# Patient Record
Sex: Male | Born: 1960 | Race: Black or African American | Hispanic: No | Marital: Married | Smoking: Current every day smoker
Health system: Southern US, Community
[De-identification: ages and names within clinical notes are randomized; demographics above are authoritative.]

## PROBLEM LIST (undated history)

## (undated) DIAGNOSIS — R351 Nocturia: Secondary | ICD-10-CM

## (undated) HISTORY — PX: ROTATOR CUFF REPAIR: SHX139

---

## 2014-05-25 ENCOUNTER — Emergency Department (HOSPITAL_BASED_OUTPATIENT_CLINIC_OR_DEPARTMENT_OTHER)
Admission: EM | Admit: 2014-05-25 | Discharge: 2014-05-25 | Disposition: A | Discharge status: Home / Self Care | Payer: Self-pay | Attending: Emergency Medicine | Admitting: Emergency Medicine

## 2014-05-25 ENCOUNTER — Encounter (HOSPITAL_BASED_OUTPATIENT_CLINIC_OR_DEPARTMENT_OTHER): Payer: Self-pay | Admitting: *Deleted

## 2014-05-25 DIAGNOSIS — Z720 Tobacco use: Secondary | ICD-10-CM | POA: Insufficient documentation

## 2014-05-25 DIAGNOSIS — Z880 Allergy status to penicillin: Secondary | ICD-10-CM | POA: Insufficient documentation

## 2014-05-25 DIAGNOSIS — N410 Acute prostatitis: Secondary | ICD-10-CM | POA: Insufficient documentation

## 2014-05-25 HISTORY — DX: Nocturia: R35.1

## 2014-05-25 LAB — URINALYSIS, ROUTINE W REFLEX MICROSCOPIC
Bilirubin Urine: NEGATIVE
GLUCOSE, UA: NEGATIVE mg/dL
Hgb urine dipstick: NEGATIVE
Ketones, ur: NEGATIVE mg/dL
Nitrite: POSITIVE — AB
PROTEIN: NEGATIVE mg/dL
Specific Gravity, Urine: 1.023 (ref 1.005–1.030)
UROBILINOGEN UA: 1 mg/dL (ref 0.0–1.0)
pH: 6 (ref 5.0–8.0)

## 2014-05-25 LAB — URINE MICROSCOPIC-ADD ON

## 2014-05-25 MED ORDER — TAMSULOSIN HCL 0.4 MG PO CAPS: 0.4000 mg | ORAL_CAPSULE | Freq: Every day | ORAL | Status: DC

## 2014-05-25 MED ORDER — CIPROFLOXACIN HCL 500 MG PO TABS: 500.0000 mg | ORAL_TABLET | Freq: Two times a day (BID) | ORAL | Status: DC

## 2014-05-25 NOTE — Discharge Instructions (Signed)

## 2014-05-25 NOTE — ED Notes (Signed)
Patient states he has been on Flomax for the last three months for nocturia.  States he ran out of his prescription last week and is beginning to have frequent urination at night with a poor stream.

## 2014-05-25 NOTE — ED Provider Notes (Signed)
CSN: [id number]     Arrival date & time 05/25/14  1248 History   First MD Initiated Contact with Patient 05/25/14 1406     Chief Complaint  Patient presents with  . Medication Refill     (Consider location/radiation/quality/duration/timing/severity/associated sxs/prior Treatment) HPI Comments: Pt comes in with c/o urinary frequency and dribbling. Pt states that he was prescribed flomax by urology and he is out of his medications and would like a refill. Denies fever or abdominal pain. Denies discharge  The history is provided by the patient. No language interpreter was used.    Past Medical History  Diagnosis Date  . Nocturia    Past Surgical History  Procedure Laterality Date  . Rotator cuff repair     No family history on file. History  Substance Use Topics  . Smoking status: Current Every Day Smoker    Types: Cigarettes  . Smokeless tobacco: Never Used  . Alcohol Use: No    Review of Systems  All other systems reviewed and are negative.     Allergies  Penicillins  Home Medications   Prior to Admission medications   Medication Sig Start Date End Date Taking? Authorizing Provider  tamsulosin (FLOMAX) 0.4 MG CAPS capsule Take 0.4 mg by mouth.   Yes Historical Provider, MD   BP 136/95 mmHg  Pulse 78  Temp(Src) 98.3 F (36.8 C) (Oral)  Resp 18  Ht 6\' 2"  (1.88 m)  Wt 180 lb (81.647 kg)  BMI 23.10 kg/m2  SpO2 100% Physical Exam  Constitutional: He is oriented to person, place, and time. He appears well-developed and well-nourished.  Cardiovascular: Normal rate and regular rhythm.   Pulmonary/Chest: Effort normal and breath sounds normal.  Abdominal: Soft. Bowel sounds are normal. There is no tenderness.  Musculoskeletal: Normal range of motion.  Neurological: He is alert and oriented to person, place, and time.  Skin: Skin is warm and dry.  Nursing note and vitals reviewed.   ED Course  Procedures (including critical care time) Labs Review Labs  Reviewed  URINALYSIS, ROUTINE W REFLEX MICROSCOPIC - Abnormal; Notable for the following:    APPearance CLOUDY (*)    Nitrite POSITIVE (*)    Leukocytes, UA MODERATE (*)    All other components within normal limits  URINE MICROSCOPIC-ADD ON - Abnormal; Notable for the following:    Bacteria, UA MANY (*)    All other components within normal limits    Imaging Review No results found.   EKG Interpretation None      MDM   Final diagnoses:  Acute prostatitis    Pt treated for prostatitis and will fill flomax. Urine treated for culture    Teressa LowerVrinda Azharia Surratt, NP 05/25/14 1448  Vanetta MuldersScott Zackowski, MD 05/28/14 [id number]

## 2016-12-07 ENCOUNTER — Other Ambulatory Visit: Payer: Self-pay

## 2016-12-07 ENCOUNTER — Encounter (HOSPITAL_BASED_OUTPATIENT_CLINIC_OR_DEPARTMENT_OTHER): Payer: Self-pay | Admitting: *Deleted

## 2016-12-07 ENCOUNTER — Emergency Department (HOSPITAL_BASED_OUTPATIENT_CLINIC_OR_DEPARTMENT_OTHER)
Admission: EM | Admit: 2016-12-07 | Discharge: 2016-12-07 | Disposition: A | Discharge status: Home / Self Care | Payer: Self-pay | Attending: Emergency Medicine | Admitting: Emergency Medicine

## 2016-12-07 DIAGNOSIS — Z79899 Other long term (current) drug therapy: Secondary | ICD-10-CM | POA: Insufficient documentation

## 2016-12-07 DIAGNOSIS — R338 Other retention of urine: Secondary | ICD-10-CM

## 2016-12-07 DIAGNOSIS — F17210 Nicotine dependence, cigarettes, uncomplicated: Secondary | ICD-10-CM | POA: Insufficient documentation

## 2016-12-07 DIAGNOSIS — R339 Retention of urine, unspecified: Secondary | ICD-10-CM | POA: Insufficient documentation

## 2016-12-07 LAB — CBC WITH DIFFERENTIAL/PLATELET
Basophils Absolute: 0 10*3/uL (ref 0.0–0.1)
Basophils Relative: 0 %
EOS ABS: 0 10*3/uL (ref 0.0–0.7)
Eosinophils Relative: 0 %
HCT: 40.1 % (ref 39.0–52.0)
Hemoglobin: 13.8 g/dL (ref 13.0–17.0)
LYMPHS ABS: 1.2 10*3/uL (ref 0.7–4.0)
LYMPHS PCT: 17 %
MCH: 30.4 pg (ref 26.0–34.0)
MCHC: 34.4 g/dL (ref 30.0–36.0)
MCV: 88.3 fL (ref 78.0–100.0)
Monocytes Absolute: 0.5 10*3/uL (ref 0.1–1.0)
Monocytes Relative: 7 %
Neutro Abs: 5.4 10*3/uL (ref 1.7–7.7)
Neutrophils Relative %: 77 %
PLATELETS: 227 10*3/uL (ref 150–400)
RBC: 4.54 MIL/uL (ref 4.22–5.81)
RDW: 15.3 % (ref 11.5–15.5)
WBC: 7 10*3/uL (ref 4.0–10.5)

## 2016-12-07 LAB — URINALYSIS, ROUTINE W REFLEX MICROSCOPIC
Bilirubin Urine: NEGATIVE
Glucose, UA: NEGATIVE mg/dL
Ketones, ur: NEGATIVE mg/dL
NITRITE: NEGATIVE
PROTEIN: 100 mg/dL — AB
Specific Gravity, Urine: >1.03 — ABNORMAL HIGH (ref 1.005–1.030)
pH: 6 (ref 5.0–8.0)

## 2016-12-07 LAB — URINALYSIS, MICROSCOPIC (REFLEX)

## 2016-12-07 LAB — BASIC METABOLIC PANEL
Anion gap: 5 (ref 5–15)
BUN: 22 mg/dL — AB (ref 6–20)
CHLORIDE: 108 mmol/L (ref 101–111)
CO2: 24 mmol/L (ref 22–32)
Calcium: 9.2 mg/dL (ref 8.9–10.3)
Creatinine, Ser: 0.96 mg/dL (ref 0.61–1.24)
GFR calc Af Amer: >60 mL/min (ref >60)
GFR calc non Af Amer: >60 mL/min (ref >60)
Glucose, Bld: 114 mg/dL — ABNORMAL HIGH (ref 65–99)
Potassium: 3.7 mmol/L (ref 3.5–5.1)
Sodium: 137 mmol/L (ref 135–145)

## 2016-12-07 NOTE — ED Provider Notes (Signed)
[facility] EMERGENCY DEPARTMENT Provider Note   CSN: [id number] Arrival date & time: 12/07/16  4098     History   Chief Complaint Chief Complaint  Patient presents with  . Urinary Frequency    HPI TYNER CODNER is a 56 y.o. male.  The history is provided by the patient. No language interpreter was used.  Urinary Frequency     DASCHEL ROUGHTON is a 56 y.o. male who presents to the Emergency Department complaining of urinary frequency.  Reports that one week of urinary frequency and inability to urinate since last night with only dribbling when attempting to urinate.  He denies any abdominal pain, nausea, vomiting, penile discharge.  No new sexual partners.  He had similar symptoms several years ago and was treated with Flomax.  He does not have a family doctor and has not had any routine medical evaluations.  Past Medical History:  Diagnosis Date  . Nocturia     There are no active problems to display for this patient.   Past Surgical History:  Procedure Laterality Date  . ROTATOR CUFF REPAIR         Home Medications    Prior to Admission medications   Medication Sig Start Date End Date Taking? Authorizing Provider  ciprofloxacin (CIPRO) 500 MG tablet Take 1 tablet (500 mg total) by mouth 2 (two) times daily. 05/25/14   Teressa Lower, NP  tamsulosin (FLOMAX) 0.4 MG CAPS capsule Take 1 capsule (0.4 mg total) by mouth daily. 05/25/14   Teressa Lower, NP    Family History No family history on file.  Social History Social History   Tobacco Use  . Smoking status: Current Every Day Smoker    Types: Cigarettes  . Smokeless tobacco: Never Used  Substance Use Topics  . Alcohol use: No  . Drug use: No     Allergies   Penicillins   Review of Systems Review of Systems  Genitourinary: Positive for frequency.  All other systems reviewed and are negative.    Physical Exam Updated Vital Signs BP 134/85 (BP Location: Right Arm)   Pulse  68   Temp 98.2 F (36.8 C) (Oral)   Resp 20   Ht 6\' 2"  (1.88 m)   Wt 78.9 kg (174 lb)   SpO2 100%   BMI 22.34 kg/m   Physical Exam  Constitutional: He is oriented to person, place, and time. He appears well-developed and well-nourished.  HENT:  Head: Normocephalic and atraumatic.  Cardiovascular: Normal rate and regular rhythm.  No murmur heard. Pulmonary/Chest: Effort normal and breath sounds normal. No respiratory distress.  Abdominal:  Distended abdomen with lower abdominal fullness without any tenderness.  Musculoskeletal: He exhibits no edema or tenderness.  Neurological: He is alert and oriented to person, place, and time.  Skin: Skin is warm and dry.  Psychiatric: He has a normal mood and affect. His behavior is normal.  Nursing note and vitals reviewed.    ED Treatments / Results  Labs (all labs ordered are listed, but only abnormal results are displayed) Labs Reviewed  URINALYSIS, ROUTINE W REFLEX MICROSCOPIC - Abnormal; Notable for the following components:      Result Value   Specific Gravity, Urine >1.030 (*)    Hgb urine dipstick LARGE (*)    Protein, ur 100 (*)    Leukocytes, UA SMALL (*)    All other components within normal limits  URINALYSIS, MICROSCOPIC (REFLEX) - Abnormal; Notable for the following components:   Bacteria, UA  FEW (*)    Squamous Epithelial / LPF 0-5 (*)    All other components within normal limits  BASIC METABOLIC PANEL - Abnormal; Notable for the following components:   Glucose, Bld 114 (*)    BUN 22 (*)    All other components within normal limits  URINE CULTURE  CBC WITH DIFFERENTIAL/PLATELET    EKG  EKG Interpretation None       Radiology No results found.  Procedures Procedures (including critical care time)  Medications Ordered in ED Medications - No data to display   Initial Impression / Assessment and Plan / ED Course  I have reviewed the triage vital signs and the nursing notes.  Pertinent labs & imaging  results that were available during my care of the patient were reviewed by me and considered in my medical decision making (see chart for details).     Pt here for dribbling urination and difficulty urinating for the last week.  Foley catheter placed with 650 mL's returned.  Patient feeling significantly improved after Foley catheter placement.  UA is not consistent with UTI.  Labs demonstrate normal renal function.  Discussed with patient home care for urinary retention, Foley catheter care.  Discussed importance of urology follow-up for further evaluation and Foley catheter removal. Final Clinical Impressions(s) / ED Diagnoses   Final diagnoses:  Acute urinary retention    ED Discharge Orders    None       Tilden Fossaees, Janika Jedlicka, MD 12/07/16 1122

## 2016-12-07 NOTE — ED Triage Notes (Signed)
Pt reports reports urinary frequency x 1 week and bladder pain. States he is dribbling urine

## 2016-12-08 LAB — URINE CULTURE: Culture: NO GROWTH

## 2016-12-10 ENCOUNTER — Emergency Department (HOSPITAL_BASED_OUTPATIENT_CLINIC_OR_DEPARTMENT_OTHER)
Admission: EM | Admit: 2016-12-10 | Discharge: 2016-12-10 | Disposition: A | Discharge status: Home / Self Care | Payer: Self-pay | Attending: Emergency Medicine | Admitting: Emergency Medicine

## 2016-12-10 ENCOUNTER — Encounter (HOSPITAL_BASED_OUTPATIENT_CLINIC_OR_DEPARTMENT_OTHER): Payer: Self-pay | Admitting: *Deleted

## 2016-12-10 ENCOUNTER — Other Ambulatory Visit: Payer: Self-pay

## 2016-12-10 DIAGNOSIS — F17210 Nicotine dependence, cigarettes, uncomplicated: Secondary | ICD-10-CM | POA: Insufficient documentation

## 2016-12-10 DIAGNOSIS — N390 Urinary tract infection, site not specified: Secondary | ICD-10-CM | POA: Insufficient documentation

## 2016-12-10 DIAGNOSIS — Z466 Encounter for fitting and adjustment of urinary device: Secondary | ICD-10-CM | POA: Insufficient documentation

## 2016-12-10 DIAGNOSIS — R319 Hematuria, unspecified: Secondary | ICD-10-CM | POA: Insufficient documentation

## 2016-12-10 LAB — URINALYSIS, ROUTINE W REFLEX MICROSCOPIC

## 2016-12-10 LAB — URINALYSIS, MICROSCOPIC (REFLEX)

## 2016-12-10 MED ORDER — CIPROFLOXACIN HCL 500 MG PO TABS: 500.0000 mg | ORAL_TABLET | Freq: Two times a day (BID) | ORAL | 0 refills | Status: AC

## 2016-12-10 MED ORDER — TAMSULOSIN HCL 0.4 MG PO CAPS
0.4000 mg | ORAL_CAPSULE | Freq: Once | ORAL | Status: AC
  Administered 2016-12-10: 0.4 mg via ORAL
  Filled 2016-12-10: qty 1

## 2016-12-10 MED ORDER — TAMSULOSIN HCL 0.4 MG PO CAPS: 0.4000 mg | ORAL_CAPSULE | Freq: Every day | ORAL | 1 refills | Status: DC

## 2016-12-10 MED FILL — TAMSULOSIN HCL 0.4 MG CAP: 0.4 | 30 days supply | Qty: 30 | Fill #0

## 2016-12-10 MED FILL — CIPROFLOXACIN HCL 500 MG TA: 500 | 10 days supply | Qty: 20 | Fill #0

## 2016-12-10 NOTE — ED Triage Notes (Signed)
Pt states he was seen here 3 days ago and had foley placed for retention and told to return today for removal. Pt was unable to make his follow up apt with urology due to not having $250 to pay up front.

## 2016-12-10 NOTE — ED Notes (Addendum)
Urinal provided to pt, made aware of need for UA.

## 2016-12-10 NOTE — ED Provider Notes (Signed)
[facility] EMERGENCY DEPARTMENT Provider Note   CSN: [id number] Arrival date & time: 12/10/16  0830     History   Chief Complaint Chief Complaint  Patient presents with  . Follow-up    HPI Tommy West is a 56 y.o. male.  HPI   Presents with desire for urinary cathter removal. Reports history of prostate issues with urinary difficulties who presented 2 days ago with concern for urinary frequency, hesitancy, inability to urinate and had foley cathter placed. Reports that when he takes flomax he is able to avoid obstruction symptoms, however he ran out of his flomax resulting in the obstruction symptoms he had a few days ago.  He noted some pink colored hematuria in his cathter today. It is flowing normally.  He attempted to see Urology but they required 250 dollar copay and he was unable to go to appointment. Reports he is scheduled Dec 13th to see resident in urology clinic.  Denies fevers, flank pain, vomiting.    Past Medical History:  Diagnosis Date  . Nocturia     There are no active problems to display for this patient.   Past Surgical History:  Procedure Laterality Date  . ROTATOR CUFF REPAIR         Home Medications    Prior to Admission medications   Medication Sig Start Date End Date Taking? Authorizing Provider  ciprofloxacin (CIPRO) 500 MG tablet Take 1 tablet (500 mg total) 2 (two) times daily for 10 days by mouth. 12/10/16 12/20/16  Alvira Monday, MD  tamsulosin (FLOMAX) 0.4 MG CAPS capsule Take 1 capsule (0.4 mg total) daily by mouth. 12/10/16   Alvira Monday, MD    Family History History reviewed. No pertinent family history.  Social History Social History   Tobacco Use  . Smoking status: Current Every Day Smoker    Types: Cigarettes  . Smokeless tobacco: Never Used  Substance Use Topics  . Alcohol use: No  . Drug use: No     Allergies   Penicillins   Review of Systems Review of Systems  Constitutional: Negative  for fever.  HENT: Negative for sore throat.   Eyes: Negative for visual disturbance.  Respiratory: Negative for shortness of breath.   Cardiovascular: Negative for chest pain.  Gastrointestinal: Negative for abdominal pain, diarrhea and vomiting.  Genitourinary: Positive for difficulty urinating and hematuria. Negative for flank pain.  Musculoskeletal: Negative for back pain and neck stiffness.  Skin: Negative for rash.  Neurological: Negative for syncope and headaches.     Physical Exam Updated Vital Signs BP (!) 154/102 (BP Location: Right Arm)   Pulse 72   Temp 98.2 F (36.8 C) (Oral)   Resp 16   Ht 6\' 2"  (1.88 m)   Wt 78.5 kg (173 lb)   SpO2 98%   BMI 22.21 kg/m   Physical Exam  Constitutional: He is oriented to person, place, and time. He appears well-developed and well-nourished. No distress.  HENT:  Head: Normocephalic and atraumatic.  Eyes: Conjunctivae and EOM are normal.  Neck: Normal range of motion.  Cardiovascular: Normal rate, regular rhythm and intact distal pulses.  Pulmonary/Chest: Effort normal and breath sounds normal. No respiratory distress. He has no wheezes. He has no rales.  Abdominal: Soft. He exhibits no distension. There is no tenderness. There is no guarding.  Musculoskeletal: He exhibits no edema.  Neurological: He is alert and oriented to person, place, and time.  Skin: Skin is warm and dry. He is not diaphoretic.  Nursing note and vitals reviewed.    ED Treatments / Results  Labs (all labs ordered are listed, but only abnormal results are displayed) Labs Reviewed  URINALYSIS, ROUTINE W REFLEX MICROSCOPIC - Abnormal; Notable for the following components:      Result Value   Color, Urine RED (*)    APPearance TURBID (*)    Glucose, UA   (*)    Value: TEST NOT REPORTED DUE TO COLOR INTERFERENCE OF URINE PIGMENT   Hgb urine dipstick   (*)    Value: TEST NOT REPORTED DUE TO COLOR INTERFERENCE OF URINE PIGMENT   Bilirubin Urine   (*)     Value: TEST NOT REPORTED DUE TO COLOR INTERFERENCE OF URINE PIGMENT   Ketones, ur   (*)    Value: TEST NOT REPORTED DUE TO COLOR INTERFERENCE OF URINE PIGMENT   Protein, ur   (*)    Value: TEST NOT REPORTED DUE TO COLOR INTERFERENCE OF URINE PIGMENT   Nitrite   (*)    Value: TEST NOT REPORTED DUE TO COLOR INTERFERENCE OF URINE PIGMENT   Leukocytes, UA   (*)    Value: TEST NOT REPORTED DUE TO COLOR INTERFERENCE OF URINE PIGMENT   All other components within normal limits  URINALYSIS, MICROSCOPIC (REFLEX) - Abnormal; Notable for the following components:   Bacteria, UA FEW (*)    Squamous Epithelial / LPF 0-5 (*)    All other components within normal limits  URINE CULTURE    EKG  EKG Interpretation None       Radiology No results found.  Procedures Procedures (including critical care time)  Medications Ordered in ED Medications  tamsulosin (FLOMAX) capsule 0.4 mg (0.4 mg Oral Given 12/10/16 0932)     Initial Impression / Assessment and Plan / ED Course  I have reviewed the triage vital signs and the nursing notes.  Pertinent labs & imaging results that were available during my care of the patient were reviewed by me and considered in my medical decision making (see chart for details).     56yo male with history of urinary retention with foley catheter placement 2 days ago presents with desire for catheter removal. Discussed options including patient keeping indwelling foley until follow up visit scheduled in December, versus voiding trial today.  He reports that when he is on flomax he does not have these issues, however he ran out of flomax resulting in retention 2 days ago.  Given this history, discussed it is not unreasonable to trial removing foley and initiating flomax.  Removed foley and patient able to urinate.  Minimal urine on post void residual.  Urinalysis shows hematuria, difficult to interpret, however will treat for possible UTI with cipro.  Renal function normal  days ago after he present with obstruction symptoms. Discussed symptoms of obstruction in detail with patient and encouraged return if he again is unable to fully empty bladder, risks of renal injury if he has persistent obstruction, and importance of outpatient follow up.  Pt states understanding. Patient discharged in stable condition with understanding of reasons to return.   Final Clinical Impressions(s) / ED Diagnoses   Final diagnoses:  Encounter for Foley catheter removal  Hematuria, unspecified type  Urinary tract infection with hematuria, site unspecified    ED Discharge Orders        Ordered    ciprofloxacin (CIPRO) 500 MG tablet  2 times daily     12/10/16 1256    tamsulosin (FLOMAX) 0.4 MG CAPS  capsule  Daily     12/10/16 1256       Alvira MondaySchlossman, Laurieanne Galloway, MD 12/11/16 [phone]

## 2016-12-11 LAB — URINE CULTURE

## 2017-01-08 MED FILL — TAMSULOSIN HCL 0.4 MG CAP: 0.4 | 30 days supply | Qty: 30 | Fill #1

## 2017-12-03 ENCOUNTER — Encounter (HOSPITAL_BASED_OUTPATIENT_CLINIC_OR_DEPARTMENT_OTHER): Payer: Self-pay | Admitting: Emergency Medicine

## 2017-12-03 ENCOUNTER — Emergency Department (HOSPITAL_BASED_OUTPATIENT_CLINIC_OR_DEPARTMENT_OTHER)
Admission: EM | Admit: 2017-12-03 | Discharge: 2017-12-03 | Disposition: A | Discharge status: Home / Self Care | Payer: BLUE CROSS/BLUE SHIELD | Attending: Emergency Medicine | Admitting: Emergency Medicine

## 2017-12-03 ENCOUNTER — Emergency Department (HOSPITAL_BASED_OUTPATIENT_CLINIC_OR_DEPARTMENT_OTHER): Payer: BLUE CROSS/BLUE SHIELD

## 2017-12-03 ENCOUNTER — Other Ambulatory Visit: Payer: Self-pay

## 2017-12-03 DIAGNOSIS — N210 Calculus in bladder: Secondary | ICD-10-CM | POA: Diagnosis not present

## 2017-12-03 DIAGNOSIS — N401 Enlarged prostate with lower urinary tract symptoms: Secondary | ICD-10-CM | POA: Diagnosis not present

## 2017-12-03 DIAGNOSIS — R300 Dysuria: Secondary | ICD-10-CM | POA: Diagnosis present

## 2017-12-03 DIAGNOSIS — F17210 Nicotine dependence, cigarettes, uncomplicated: Secondary | ICD-10-CM | POA: Insufficient documentation

## 2017-12-03 DIAGNOSIS — Z79899 Other long term (current) drug therapy: Secondary | ICD-10-CM | POA: Insufficient documentation

## 2017-12-03 LAB — BASIC METABOLIC PANEL
Anion gap: 9 (ref 5–15)
BUN: 18 mg/dL (ref 6–20)
CO2: 26 mmol/L (ref 22–32)
Calcium: 9 mg/dL (ref 8.9–10.3)
Chloride: 101 mmol/L (ref 98–111)
Creatinine, Ser: 1.01 mg/dL (ref 0.61–1.24)
GFR calc Af Amer: >60 mL/min (ref >60)
GFR calc non Af Amer: >60 mL/min (ref >60)
Glucose, Bld: 91 mg/dL (ref 70–99)
Potassium: 3.4 mmol/L — ABNORMAL LOW (ref 3.5–5.1)
Sodium: 136 mmol/L (ref 135–145)

## 2017-12-03 LAB — URINALYSIS, ROUTINE W REFLEX MICROSCOPIC
BILIRUBIN URINE: NEGATIVE
Glucose, UA: NEGATIVE mg/dL
KETONES UR: NEGATIVE mg/dL
Leukocytes, UA: NEGATIVE
NITRITE: NEGATIVE
PH: 6 (ref 5.0–8.0)
Protein, ur: NEGATIVE mg/dL
Specific Gravity, Urine: 1.025 (ref 1.005–1.030)

## 2017-12-03 LAB — CBC
HEMATOCRIT: 41.6 % (ref 39.0–52.0)
Hemoglobin: 13.5 g/dL (ref 13.0–17.0)
MCH: 29.6 pg (ref 26.0–34.0)
MCHC: 32.5 g/dL (ref 30.0–36.0)
MCV: 91.2 fL (ref 80.0–100.0)
Platelets: 215 10*3/uL (ref 150–400)
RBC: 4.56 MIL/uL (ref 4.22–5.81)
RDW: 14.9 % (ref 11.5–15.5)
WBC: 8.1 10*3/uL (ref 4.0–10.5)
nRBC: 0 % (ref 0.0–0.2)

## 2017-12-03 LAB — URINALYSIS, MICROSCOPIC (REFLEX)

## 2017-12-03 MED ORDER — TAMSULOSIN HCL 0.4 MG PO CAPS
0.4000 mg | ORAL_CAPSULE | Freq: Every day | ORAL | 1 refills | Status: AC
Start: 2017-12-03 — End: ?

## 2017-12-03 MED FILL — TAMSULOSIN HCL 0.4 MG CAP: 0.4 | 30 days supply | Qty: 30 | Fill #0

## 2017-12-03 NOTE — ED Notes (Signed)
ED Provider at bedside. 

## 2017-12-03 NOTE — Discharge Instructions (Signed)
Your labs overall look good and your urine does not show any signs of infection but your CT scan showed several bladder stones, the largest of which was 14 mm, you will need to follow-up with urology regarding these as they will likely have to do lithotripsy to help make them smaller so that they can be passed.  These call today to schedule a follow-up appointment with urology, continue taking tamsulosin as prescribed.  Tylenol as needed for pain.  Return to the emergency department if you are unable to pass urine, develop fevers, worsening urinary symptoms, persistent vomiting or severe flank or abdominal pain or any other new or concerning symptoms.

## 2017-12-03 NOTE — ED Provider Notes (Signed)
[facility] EMERGENCY DEPARTMENT Provider Note   CSN: [id number] Arrival date & time: 12/03/17  1206     History   Chief Complaint Chief Complaint  Patient presents with  . Dysuria    HPI Tommy West is a 57 y.o. male.  Tommy West is a 57 y.o. Male who presents to the emergency department for evaluation of dysuria.  He reports for the past 2-3 weeks he has had intermittent discomfort with urination and suprapubic pain.  He also reports some intermittent pains in his right flank.  He reports about a month ago he passed 2 small stones which he has brought with him in a small envelope, reports since then he is intermittently had some blood in his urine he denies any burning with urination, or urinary frequency.  But reports every time he urinates now he has pain suprapubically and his penis.  He denies any pain or swelling in the testicles or scrotum.  He has not had any fevers, denies any nausea or vomiting.  He reports prior to passing those 2 stones a month ago he has no history of kidney stones.  He has not followed up with his primary care doctor or seen urology regarding this.  He does have a history of BPH and takes Flomax regularly. No penile discharge, no new sexual partners.     Past Medical History:  Diagnosis Date  . Nocturia     There are no active problems to display for this patient.   Past Surgical History:  Procedure Laterality Date  . ROTATOR CUFF REPAIR          Home Medications    Prior to Admission medications   Medication Sig Start Date End Date Taking? Authorizing Provider  tamsulosin (FLOMAX) 0.4 MG CAPS capsule Take 1 capsule (0.4 mg total) by mouth daily. 12/03/17   Dartha Lodge, PA-C    Family History History reviewed. No pertinent family history.  Social History Social History   Tobacco Use  . Smoking status: Current Every Day Smoker    Types: Cigarettes  . Smokeless tobacco: Never Used  Substance Use Topics  .  Alcohol use: No  . Drug use: No     Allergies   Penicillins   Review of Systems Review of Systems  Constitutional: Negative for chills and fever.  Respiratory: Negative for cough and shortness of breath.   Cardiovascular: Negative for chest pain.  Gastrointestinal: Positive for abdominal pain (suprapubic). Negative for blood in stool, constipation, diarrhea, nausea and vomiting.  Genitourinary: Positive for dysuria, flank pain, hematuria and penile pain. Negative for frequency, scrotal swelling and testicular pain.  Musculoskeletal: Negative for back pain.  Skin: Negative for color change and rash.  All other systems reviewed and are negative.    Physical Exam Updated Vital Signs BP (!) 154/81 (BP Location: Left Arm)   Pulse 66   Temp 98.2 F (36.8 C) (Oral)   Resp 20   Ht 6' 2.5" (1.892 m)   Wt 77.1 kg   SpO2 100%   BMI 21.53 kg/m   Physical Exam  Constitutional: He appears well-developed and well-nourished. No distress.  HENT:  Head: Normocephalic and atraumatic.  Mouth/Throat: Oropharynx is clear and moist.  Eyes: Right eye exhibits no discharge. Left eye exhibits no discharge.  Neck: Neck supple.  Cardiovascular: Normal rate, regular rhythm, normal heart sounds and intact distal pulses.  Pulmonary/Chest: Effort normal and breath sounds normal. No respiratory distress.  Respirations equal and unlabored,  patient able to speak in full sentences, lungs clear to auscultation bilaterally  Abdominal: Soft. Bowel sounds are normal. He exhibits no distension and no mass. There is no tenderness. There is no guarding.  Abdomen soft, nondistended, nontender to palpation in all quadrants without guarding or peritoneal signs, no CVA tenderness bilaterally  Genitourinary:  Genitourinary Comments: Chaperone present Uncircumcised penis, no discharge noted at urinary meatus, no penile tenderness, no pain or swelling over the testicles or scrotum  Neurological: He is alert.  Coordination normal.  Skin: Skin is warm and dry. Capillary refill takes less than 2 seconds. He is not diaphoretic.  Psychiatric: He has a normal mood and affect. His behavior is normal.  Nursing note and vitals reviewed.    ED Treatments / Results  Labs (all labs ordered are listed, but only abnormal results are displayed) Labs Reviewed  URINALYSIS, ROUTINE W REFLEX MICROSCOPIC - Abnormal; Notable for the following components:      Result Value   APPearance CLOUDY (*)    Hgb urine dipstick LARGE (*)    All other components within normal limits  URINALYSIS, MICROSCOPIC (REFLEX) - Abnormal; Notable for the following components:   Bacteria, UA FEW (*)    All other components within normal limits  BASIC METABOLIC PANEL - Abnormal; Notable for the following components:   Potassium 3.4 (*)    All other components within normal limits  CBC    EKG None  Radiology Ct Renal Stone Study  Result Date: 12/03/2017 CLINICAL DATA:  Dysuria and flank pain.  History of kidney stones. EXAM: CT ABDOMEN AND PELVIS WITHOUT CONTRAST TECHNIQUE: Multidetector CT imaging of the abdomen and pelvis was performed following the standard protocol without IV contrast. COMPARISON:  None. FINDINGS: Lower chest: No acute abnormality. Hepatobiliary: No focal liver abnormality is seen. No gallstones, gallbladder wall thickening, or biliary dilatation. Pancreas: Unremarkable. No pancreatic ductal dilatation or surrounding inflammatory changes. Spleen: Normal in size without focal abnormality. Adrenals/Urinary Tract: Adrenal glands are unremarkable. Kidneys are normal, without renal calculi, focal lesion, or hydronephrosis. There is a 2 mm calculus in the bladder near the urethra origin. There are multiple, at least 6 additional large calculi layering in the posterior bladder, measuring up to 14 mm. Stomach/Bowel: Stomach is within normal limits. Appendix appears normal. No evidence of bowel wall thickening, distention,  or inflammatory changes. Vascular/Lymphatic: No significant vascular findings are present. No enlarged abdominal or pelvic lymph nodes. Reproductive: Mild prostatomegaly. Other: No abdominal wall hernia or abnormality. No abdominopelvic ascites. No pneumoperitoneum. Musculoskeletal: No acute or significant osseous findings. Prominent bilateral sacroiliac joint sclerosis with subtle erosive changes. IMPRESSION: 1. Multiple bladder calculi measuring up to 14 mm. Tiny 2 mm calculus near the urethra origin. No renal or ureteral calculi. 2. Prominent bilateral sacroiliac joint sclerosis with subtle erosive changes, consistent with sacroiliitis. Electronically Signed   By: Obie Dredge M.D.   On: 12/03/2017 13:12    Procedures Procedures (including critical care time)  Medications Ordered in ED Medications - No data to display   Initial Impression / Assessment and Plan / ED Course  I have reviewed the triage vital signs and the nursing notes.  Pertinent labs & imaging results that were available during my care of the patient were reviewed by me and considered in my medical decision making (see chart for details).  Presents to the emergency department for evaluation of 2 to 3 weeks of intermittent dysuria as well as hematuria and some suprapubic and penile discomfort.  He passed  2 small renal stones about a month ago but prior to that had no history of kidney stones, does have a history of BPH and takes Flomax.  He does not have any abdominal tenderness, no CVA tenderness he has normal vitals aside from mild hypertension and is well-appearing.  Will get urinalysis, basic labs and a CT renal stone study.  Work is overall reassuring no leukocytes ptosis, normal hemoglobin, no acute electrolyte derangements and normal renal function.  Urinalysis shows large amount of hemoglobin and red cells with no nitrites, leukocytes or white blood cells only very few bacteria present, not concerning for infection.  CT  renal stone study shows multiple bladder calculi measuring up to 14 mm with a tiny 2 mm calculus near the urethral origin no evidence of obstructing renal or ureteral calculi.  There is also signs of sacroiliitis, but patient is not having any low back discomfort.  Given reassuring lab work and no evidence of obstructing stones feel patient is stable for discharge home with continued Flomax and close follow-up with urology as given the size of some of the stones present in the bladder he will likely require lithotripsy.  I discussed this plan with patient and he expresses understanding and is in agreement, is in no pain at discharge.  Final Clinical Impressions(s) / ED Diagnoses   Final diagnoses:  Bladder stones  Dysuria    ED Discharge Orders         Ordered    tamsulosin (FLOMAX) 0.4 MG CAPS capsule  Daily     12/03/17 1504           Dartha Lodge, [place] 12/03/17 1642    Gwyneth Sprout, MD 12/04/17 2006

## 2017-12-03 NOTE — ED Triage Notes (Addendum)
Reports dysuria x 2-3 weeks.  Reports history of kidney stones.  States he passed 1 approximately 1 month ago.

## 2018-01-04 MED FILL — TAMSULOSIN HCL 0.4 MG CAP: 0.4 | 30 days supply | Qty: 30 | Fill #1

## 2018-03-19 ENCOUNTER — Emergency Department (HOSPITAL_BASED_OUTPATIENT_CLINIC_OR_DEPARTMENT_OTHER): Payer: BLUE CROSS/BLUE SHIELD

## 2018-03-19 ENCOUNTER — Emergency Department (HOSPITAL_BASED_OUTPATIENT_CLINIC_OR_DEPARTMENT_OTHER)
Admission: EM | Admit: 2018-03-19 | Discharge: 2018-03-19 | Disposition: A | Discharge status: Home / Self Care | Payer: BLUE CROSS/BLUE SHIELD | Attending: Emergency Medicine | Admitting: Emergency Medicine

## 2018-03-19 ENCOUNTER — Other Ambulatory Visit: Payer: Self-pay

## 2018-03-19 ENCOUNTER — Encounter (HOSPITAL_BASED_OUTPATIENT_CLINIC_OR_DEPARTMENT_OTHER): Payer: Self-pay | Admitting: *Deleted

## 2018-03-19 DIAGNOSIS — R499 Unspecified voice and resonance disorder: Secondary | ICD-10-CM | POA: Diagnosis not present

## 2018-03-19 DIAGNOSIS — F17210 Nicotine dependence, cigarettes, uncomplicated: Secondary | ICD-10-CM | POA: Diagnosis not present

## 2018-03-19 DIAGNOSIS — R491 Aphonia: Secondary | ICD-10-CM | POA: Diagnosis present

## 2018-03-19 MED ORDER — DEXAMETHASONE SODIUM PHOSPHATE 10 MG/ML IJ SOLN
10.0000 mg | Freq: Once | INTRAMUSCULAR | Status: AC
  Administered 2018-03-19: 10 mg via INTRAMUSCULAR
  Filled 2018-03-19: qty 1

## 2018-03-19 MED ORDER — PANTOPRAZOLE SODIUM 40 MG PO TBEC: 40.0000 mg | DELAYED_RELEASE_TABLET | Freq: Every day | ORAL | 0 refills | Status: AC

## 2018-03-19 NOTE — Discharge Instructions (Signed)
You were seen in the ED today with voice change. Call the ENT listed for an evaluation in the coming week. Return to the ED with any trouble breathing, difficulty swallowing, or other suddenly worsening symptoms.

## 2018-03-19 NOTE — ED Provider Notes (Signed)
Emergency Department Provider Note   I have reviewed the triage vital signs and the nursing notes.   HISTORY  Chief Complaint Loss of voice   HPI Tommy West is a 58 y.o. male presents to the emergency department for evaluation of change in voice.  Symptoms have been persistent over the last week.  Patient describes voice changes that are worse in the morning and by the evening they seem to have resolved.  Patient has occasional coughing but no runny nose, congestion symptoms.  No fevers or chills.  No sore throat or pain.  He does not feel short of breath.  He is not having any difficulty swallowing either solids or liquids.  He is not experiencing any chest or epigastric pain.  No prior history of similar symptoms.  The patient does smoke cigarettes.  No radiation of symptoms or other modifying factors.  Past Medical History:  Diagnosis Date  . Nocturia     There are no active problems to display for this patient.   Past Surgical History:  Procedure Laterality Date  . ROTATOR CUFF REPAIR      Allergies Penicillins  Family History  Problem Relation Age of Onset  . Cancer Sister     Social History Social History   Tobacco Use  . Smoking status: Current Every Day Smoker    Types: Cigarettes  . Smokeless tobacco: Never Used  Substance Use Topics  . Alcohol use: No  . Drug use: No    Review of Systems  Constitutional: No fever/chills Eyes: No visual changes. ENT: No sore throat. Positive voice changes.  Cardiovascular: Denies chest pain. Respiratory: Denies shortness of breath. Gastrointestinal: No abdominal pain.  No nausea, no vomiting.  No diarrhea.  No constipation. Genitourinary: Negative for dysuria. Musculoskeletal: Negative for back pain. Skin: Negative for rash. Neurological: Negative for headaches, focal weakness or numbness.  10-point ROS otherwise negative.  ____________________________________________   PHYSICAL EXAM:  VITAL  SIGNS: ED Triage Vitals  Enc Vitals Group     BP 03/19/18 0923 131/90     Pulse Rate 03/19/18 0923 76     Resp 03/19/18 0923 18     Temp 03/19/18 0923 98 F (36.7 C)     Temp Source 03/19/18 0923 Oral     SpO2 03/19/18 0923 100 %     Weight 03/19/18 0923 170 lb (77.1 kg)     Height 03/19/18 0924 6\' 2"  (1.88 m)     Pain Score 03/19/18 0924 0   Constitutional: Alert and oriented. Well appearing and in no acute distress. Eyes: Conjunctivae are normal.  Head: Atraumatic. Nose: No congestion/rhinnorhea. Mouth/Throat: Mucous membranes are moist.  Oropharynx non-erythematous. Widely patent. Symmetric palatal rise. Managing oral secretions. No PTA. Soft submandibular compartment.  Neck: No stridor.   Cardiovascular: Normal rate, regular rhythm. Good peripheral circulation. Grossly normal heart sounds.   Respiratory: Normal respiratory effort.  No retractions. Lungs CTAB. Gastrointestinal: No distention.  Musculoskeletal: No lower extremity tenderness nor edema. No gross deformities of extremities. Neurologic:  Normal speech and language. Skin:  Skin is warm, dry and intact. No rash noted.  ____________________________________________  RADIOLOGY  Dg Neck Soft Tissue  Result Date: 03/19/2018 CLINICAL DATA:  Laryngitis for the last week.  No pain or cough. EXAM: NECK SOFT TISSUES - 1+ VIEW COMPARISON:  None. FINDINGS: The prevertebral soft tissues are normal. There is no thickening of the epiglottis or evidence of foreign body. Patient is nearly edentulous. There is moderate multilevel cervical spondylosis  with disc space narrowing and osteophyte formation from C4-5 through C6-7. IMPRESSION: Unremarkable soft tissues of the neck.  Cervical spondylosis. Electronically Signed   By: Carey Bullocks M.D.   On: 03/19/2018 10:29    ____________________________________________   PROCEDURES  Procedure(s) performed:   Procedures  None   ____________________________________________   INITIAL IMPRESSION / ASSESSMENT AND PLAN / ED COURSE  Pertinent labs & imaging results that were available during my care of the patient were reviewed by me and considered in my medical decision making (see chart for details).  Patient presents to the emergency department with loss of voice.  He is not short of breath.  Swallowing normally.  Normal external and intraoral exam.  Mild cough but does not appear to be related to laryngitis.  Patient is a smoker.  He will likely require ENT evaluation which I discussed with him.  Plan to obtain plain films of the neck but low suspicion for deep space infection or FB.   Plain film of the neck reviewed with no acute findings.  Plan for Decadron here along with Protonix in case GERD symptoms might be causing his voice change.  I also advised close ENT follow-up for direct visualization of the vocal cords and larynx especially in the setting of his Arsenio Schnorr smoking history.  Patient understanding and in agreement with plan at discharge. ____________________________________________  FINAL CLINICAL IMPRESSION(S) / ED DIAGNOSES  Final diagnoses:  Change in voice     MEDICATIONS GIVEN DURING THIS VISIT:  Medications  dexamethasone (DECADRON) injection 10 mg (10 mg Intramuscular Given 03/19/18 1039)     NEW OUTPATIENT MEDICATIONS STARTED DURING THIS VISIT:  Discharge Medication List as of 03/19/2018 10:35 AM    START taking these medications   Details  pantoprazole (PROTONIX) 40 MG tablet Take 1 tablet (40 mg total) by mouth daily for 30 days., Starting Sat 03/19/2018, Until Mon 04/18/2018, Print        Note:  This document was prepared using Dragon voice recognition software and may include unintentional dictation errors.  Alona Bene, MD Emergency Medicine    Diego Delancey, Arlyss Repress, MD 03/19/18 1949

## 2018-03-19 NOTE — ED Triage Notes (Signed)
Loss of voice for a week.  Denies any coughing or fever.

## 2019-08-08 IMAGING — CT CT RENAL STONE PROTOCOL
2 of 4 series · 16 of 46 positions shown, 18 images · non-contrast
Comparison: None.

CLINICAL DATA: Dysuria and flank pain.  History of kidney stones.

EXAM:
CT ABDOMEN AND PELVIS WITHOUT CONTRAST
TECHNIQUE: Multidetector CT imaging of the abdomen and pelvis was performed
following the standard protocol without IV contrast.

[Series 2: axial st · axial · 0.74mm/px · z∈[+742,+1118]mm · 13 of 86 slices shown, 15 images]
[im 7/86  soft-tissue]
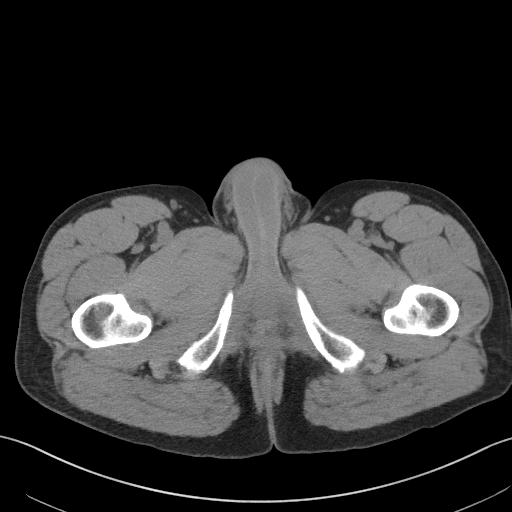
[im 7/86  bone]
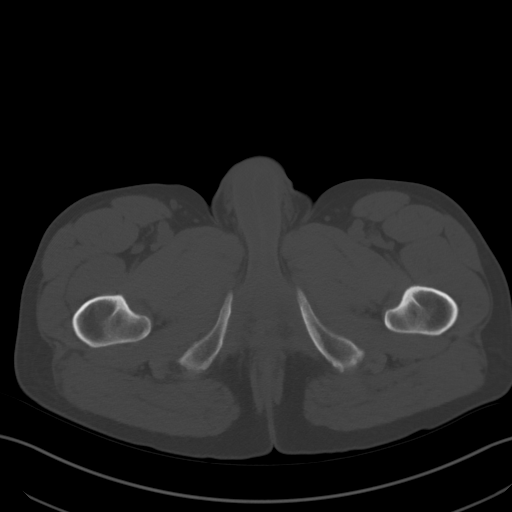
[im 13/86  soft-tissue]
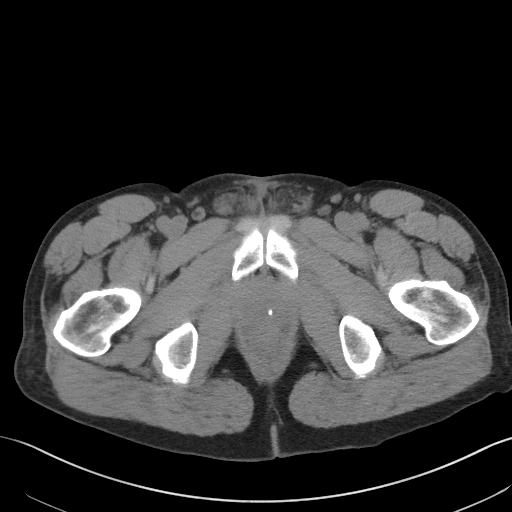
[im 19/86  soft-tissue]
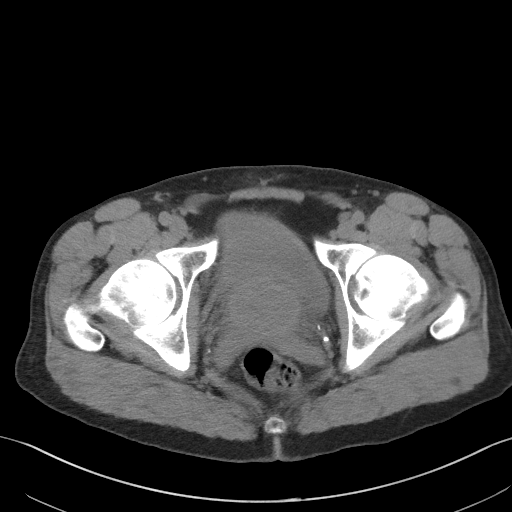
[im 26/86  soft-tissue]
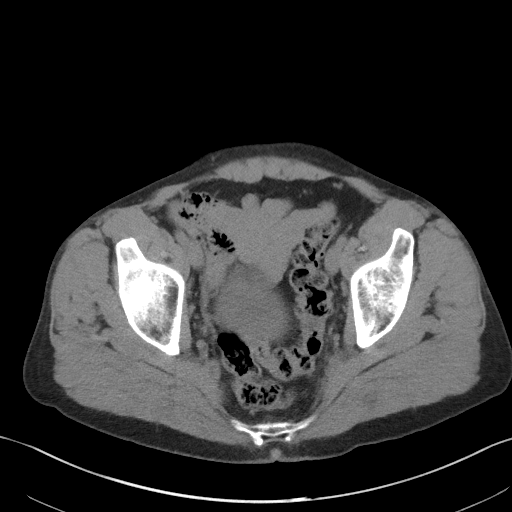
[im 32/86  soft-tissue]
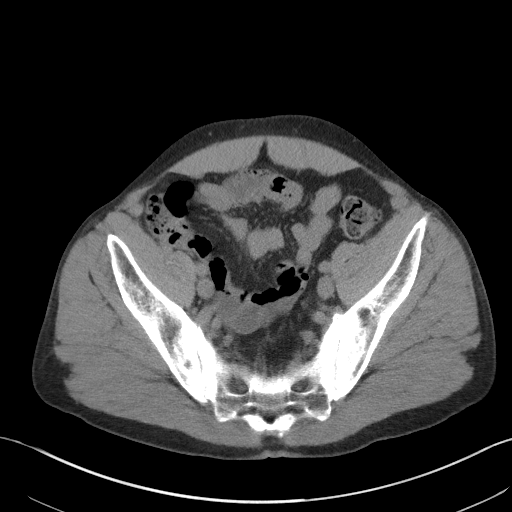
[im 38/86  soft-tissue]
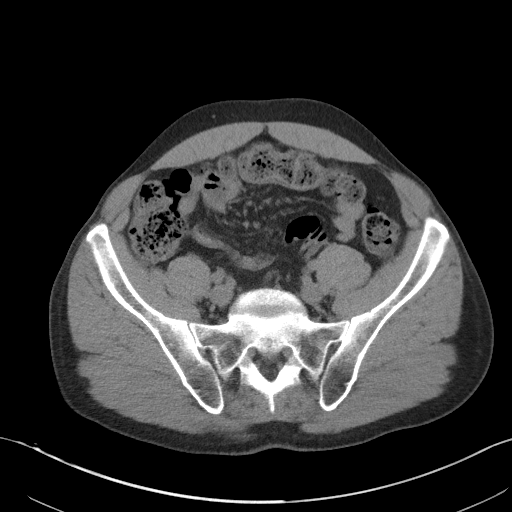
[im 45/86  soft-tissue]
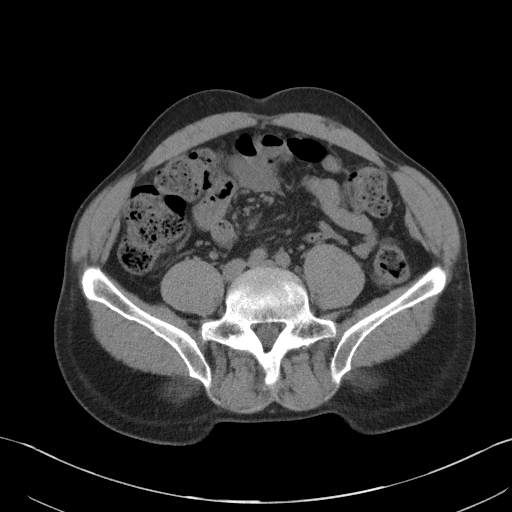
[im 51/86  soft-tissue]
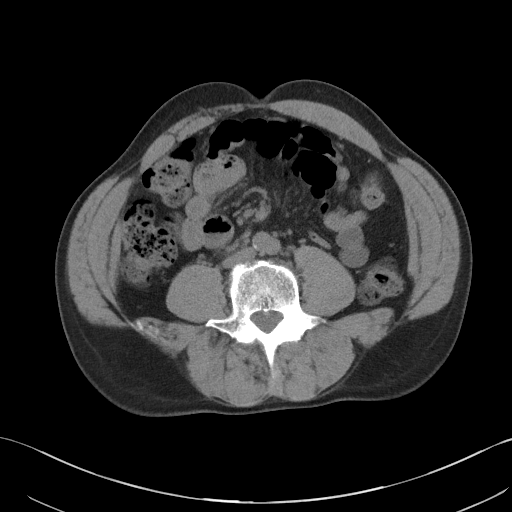
[im 57/86  soft-tissue]
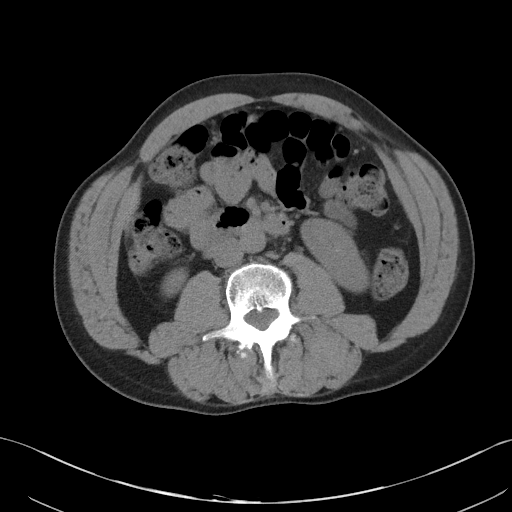
[im 57/86  bone]
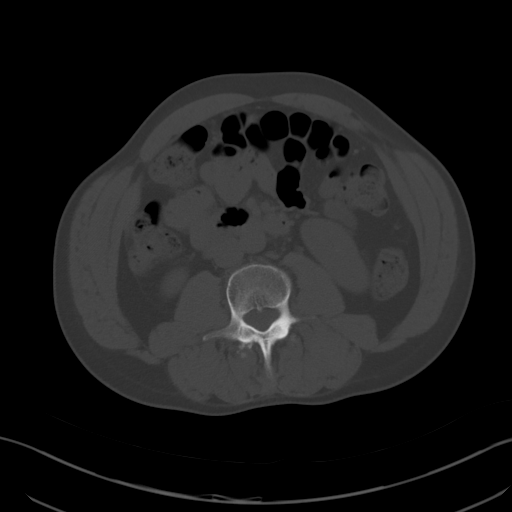
[im 63/86  soft-tissue]
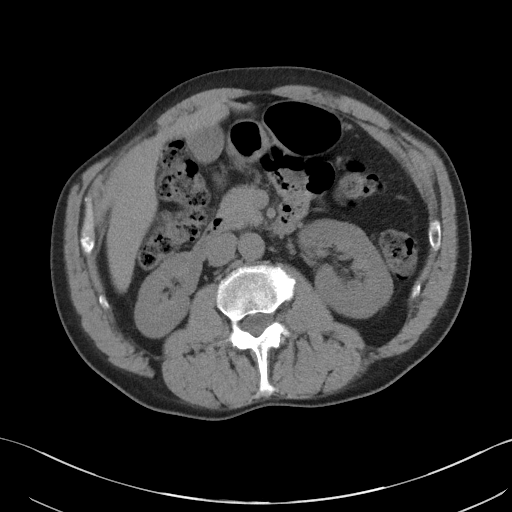
[im 70/86  soft-tissue]
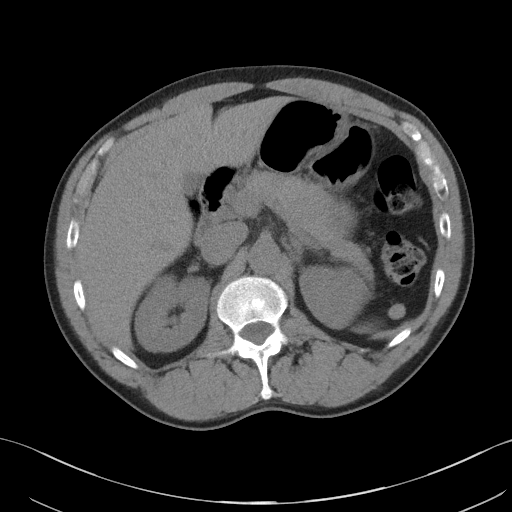
[im 76/86  soft-tissue]
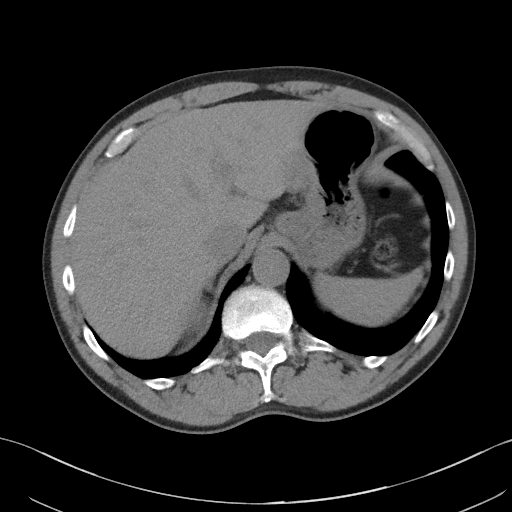
[im 82/86  soft-tissue]
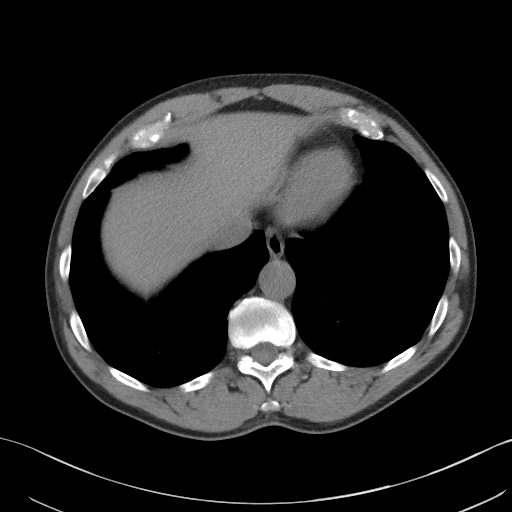

[Series 5: coronal st · coronal · 0.73mm/px · 3 of 98 slices shown]
[im 33/98  soft-tissue]
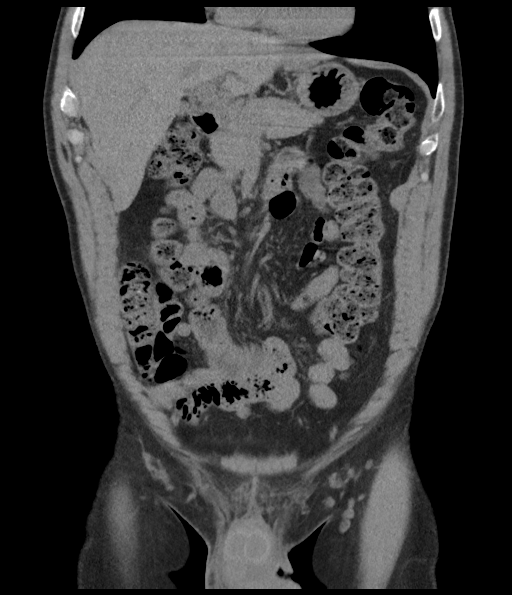
[im 44/98  soft-tissue]
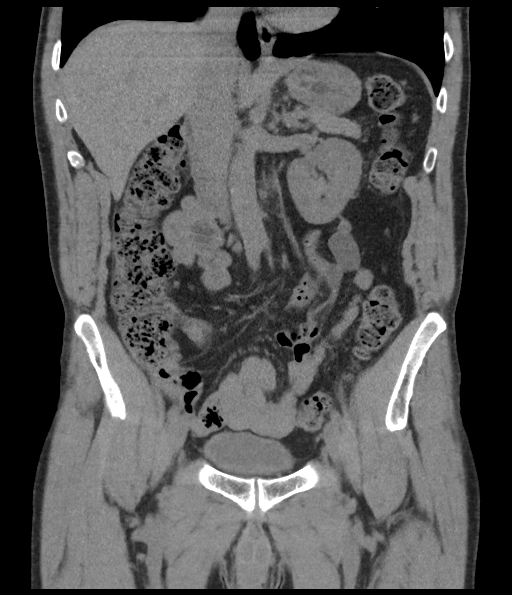
[im 54/98  soft-tissue]
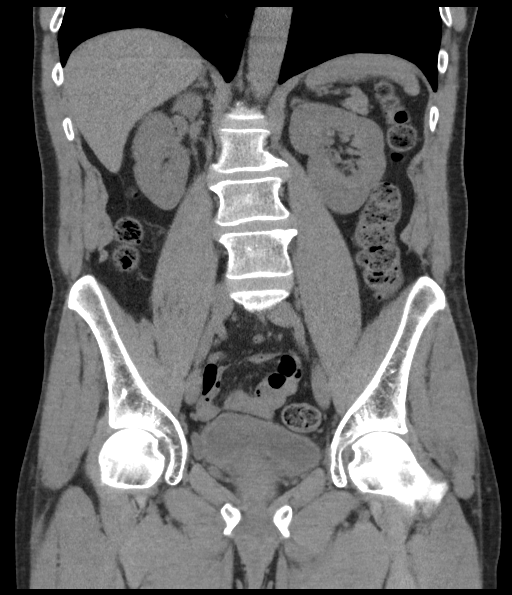

[16 of 46 positions shown; findings below may reference images not displayed]

FINDINGS: Lower chest: No acute abnormality.

Hepatobiliary: No focal liver abnormality is seen. No gallstones,
gallbladder wall thickening, or biliary dilatation.

Pancreas: Unremarkable. No pancreatic ductal dilatation or
surrounding inflammatory changes.

Spleen: Normal in size without focal abnormality.

Adrenals/Urinary Tract: Adrenal glands are unremarkable. Kidneys are
normal, without renal calculi, focal lesion, or hydronephrosis.
There is a 2 mm calculus in the bladder near the urethra origin.
There are multiple, at least 6 additional large calculi layering in
the posterior bladder, measuring up to 14 mm.

Stomach/Bowel: Stomach is within normal limits. Appendix appears
normal. No evidence of bowel wall thickening, distention, or
inflammatory changes.

Vascular/Lymphatic: No significant vascular findings are present. No
enlarged abdominal or pelvic lymph nodes.

Reproductive: Mild prostatomegaly.

Other: No abdominal wall hernia or abnormality. No abdominopelvic
ascites. No pneumoperitoneum.

Musculoskeletal: No acute or significant osseous findings. Prominent
bilateral sacroiliac joint sclerosis with subtle erosive changes.
IMPRESSION: 1. Multiple bladder calculi measuring up to 14 mm. Tiny 2 mm
calculus near the urethra origin. No renal or ureteral calculi.
2. Prominent bilateral sacroiliac joint sclerosis with subtle
erosive changes, consistent with sacroiliitis.

## 2019-11-22 IMAGING — CR DG NECK SOFT TISSUE
2 series · 2 of 2 positions shown · non-contrast
Comparison: None.

CLINICAL DATA: Laryngitis for the last week.  No pain or cough.

EXAM:
NECK SOFT TISSUES - 1+ VIEW

[w soft tissue neck]
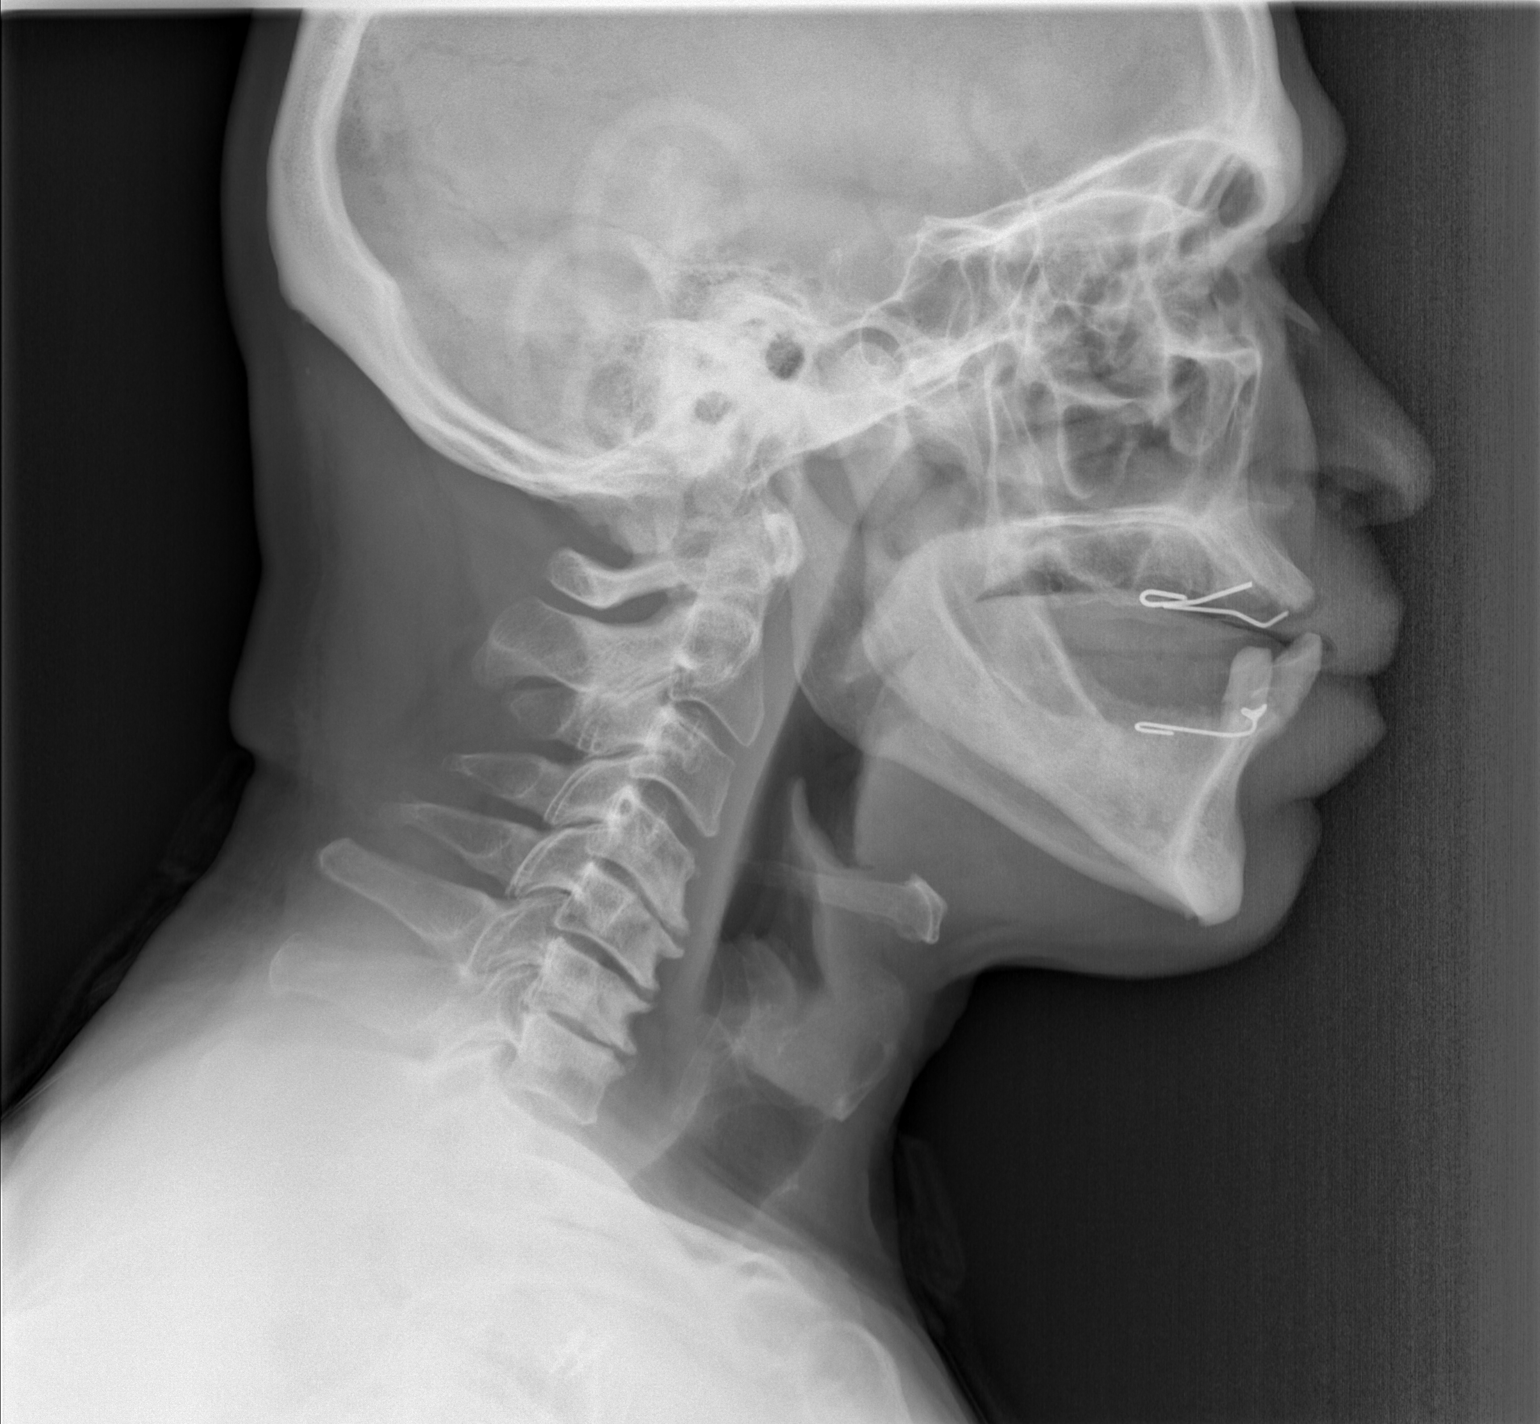

[w soft tissue neck ap]
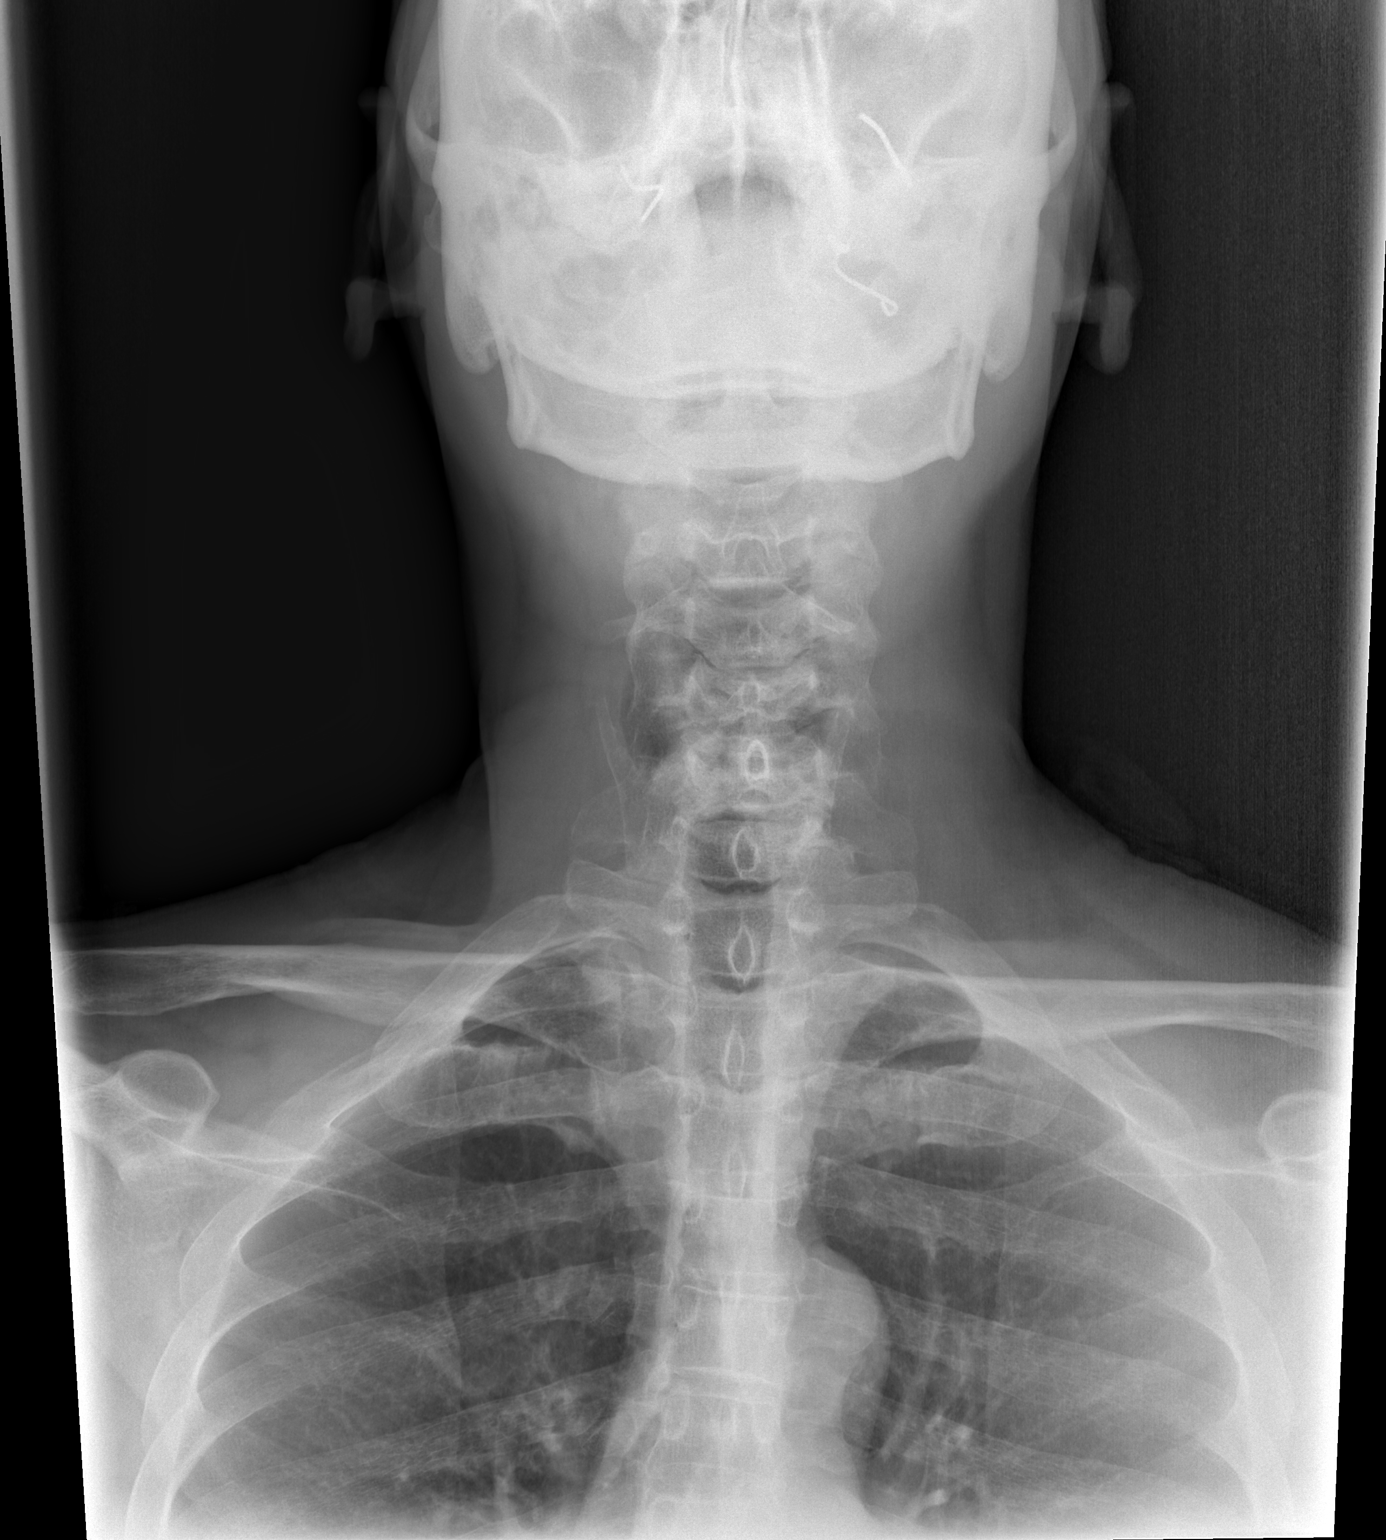

[2 of 2 positions shown; findings below may reference images not displayed]

FINDINGS: The prevertebral soft tissues are normal. There is no thickening of
the epiglottis or evidence of foreign body. Patient is nearly
edentulous. There is moderate multilevel cervical spondylosis with
disc space narrowing and osteophyte formation from C4-5 through
C6-7.
IMPRESSION: Unremarkable soft tissues of the neck.  Cervical spondylosis.

## 2021-06-02 ENCOUNTER — Other Ambulatory Visit (HOSPITAL_COMMUNITY): Payer: Self-pay | Admitting: *Deleted

## 2021-06-09 ENCOUNTER — Other Ambulatory Visit (HOSPITAL_COMMUNITY): Payer: Self-pay | Admitting: *Deleted

## 2021-06-09 DIAGNOSIS — R072 Precordial pain: Secondary | ICD-10-CM

## 2021-06-10 ENCOUNTER — Other Ambulatory Visit (HOSPITAL_BASED_OUTPATIENT_CLINIC_OR_DEPARTMENT_OTHER): Payer: Self-pay

## 2021-06-13 ENCOUNTER — Other Ambulatory Visit (HOSPITAL_BASED_OUTPATIENT_CLINIC_OR_DEPARTMENT_OTHER): Payer: BLUE CROSS/BLUE SHIELD

## 2021-06-13 ENCOUNTER — Inpatient Hospital Stay (HOSPITAL_BASED_OUTPATIENT_CLINIC_OR_DEPARTMENT_OTHER): Admission: RE | Admit: 2021-06-13 | Payer: Self-pay | Source: Ambulatory Visit

## 2023-04-08 LAB — GLUCOSE, POCT (MANUAL RESULT ENTRY): Glucose Fasting, POC: 112 mg/dL — AB (ref 70–99)

## 2023-04-08 NOTE — Progress Notes (Signed)
 Pt came in to mobile screening for BP/GLU check. Pt stated he was taking BP meds but stopped about 6 months ago. He is physically active and lives a life of minimum stress. We went over BP being elevated and Prediabetes indication with a fasting current cycle. Educated pt on speaking with provider about stopping medications. Healthy eating and quitting smoking were also discussed.

## 2023-05-13 NOTE — Progress Notes (Signed)
 The patient attended a screening event on 04/08/2023, where his blood pressure was measured at 161/87 mmHg after second check, and his blood glucose -fasting was 112 mg/dl, placing him in the prediabetic range. During the event, the patient reported that he does smoke, insurance was not documented at the event, pt is established with a primary care provider ([facility]?), and a transportation SDOH need was indicated at the event.  A chart review confirmed that the patient has [facility] as his listed PCP. Chart review further indicates no health insurance.   The patient's most recent office visit was with Garwin Kappa - [facility] on 08/10/2022. Visit notes indicates that the pt was newly established in their care. The pt was scheduled for future preventative care with this provider. There are not future [facility] visible appts indicated at this time.  CHW called [facility] to obtain PCP status, insurance, and any upcoming appts. Pt does not have a follow-up appointment with his PCP. He is currently under their care. CHW asked what insurance does the pt have, the pt currently has [employer]. Chart has been updated with PCP information. CHW called pt to discuss screening event. Pt did not answer, the vm of the number provided may not be the pt's active number due to the message being in Spanish when pt's chart indicates that the pt speaks English, left vm asking if it is the pt to return call to CHW. CHW checked pt's screening form to verify phone #, pt's number provided at screening event is not the number listed in his chart. CHW updated pt's #. CHW called pt to discuss screening event. Could not leave pt a vm due to full inbox. CHW called pt to discuss screening results. CHW asked pt if he still has a transportation need, pt indicated that he currently does not have that need and declines any smoking  and blood glucose resources as well. CHW shared screening results with pt and encouraged pt to schedule a visit with PCP due to results. CHW shared the importance of addressing smoking cessation and blood glucose being in the prediabetic range. Pt understood and will reach out to schedule appt.  At this time, no additional support from the Health Equity Team is indicated.

## 2023-08-26 LAB — AMB RESULTS CONSOLE CBG: Glucose: 114

## 2023-08-26 NOTE — Progress Notes (Signed)
 Patient came to mobile screening. BP elevated 140/95>130/89. Discussed with patient about taking medication on schedule. Referred to mobile unit for any immediate concerns. Declined SDOH but given information about [employer] you event.

## 2023-10-25 NOTE — Progress Notes (Signed)
 The patient attended a screening event on 08/26/2023 where his BP screening results was 130/89, non-fasting glucose 114. At the event the patient did not document insurance coverage and does not smoke. Patient declined having any SDOH insecurities. Pt did not list pcp. At the event pt was recommended to lower blood pressure and take B/P medication as recommended and referred to mobile unit for any immediate concerns by clinician at the event. Per chart review pt has a pcp and the last office visit was 09/02/2022 for ER follow-up. The pt BP was 156/90 on 09/02/2022. According to chart pt is currently on amlodipine to manage BP.  Post event initial f/u CHW called pt pcp office on 10/25/2023 to confirm that pt is established with pcp at [facility] and was last seen on 10/13/2023. No additional Health equity team support indicated at this time.
